# Patient Record
Sex: Female | Born: 1970 | Race: Black or African American | Hispanic: No | Smoking: Current every day smoker
Health system: Southern US, Community
[De-identification: ages and names within clinical notes are randomized; demographics above are authoritative.]

## PROBLEM LIST (undated history)

## (undated) DIAGNOSIS — E119 Type 2 diabetes mellitus without complications: Secondary | ICD-10-CM

## (undated) DIAGNOSIS — I10 Essential (primary) hypertension: Secondary | ICD-10-CM

## (undated) HISTORY — PX: CARDIAC SURGERY: SHX584

## (undated) HISTORY — PX: APPENDECTOMY: SHX54

## (undated) HISTORY — PX: HERNIA REPAIR: SHX51

## (undated) HISTORY — PX: TONSILLECTOMY: SUR1361

---

## 2019-06-24 ENCOUNTER — Other Ambulatory Visit: Payer: Self-pay

## 2019-06-24 ENCOUNTER — Encounter: Payer: Self-pay | Admitting: Emergency Medicine

## 2019-06-24 ENCOUNTER — Emergency Department: Payer: Self-pay

## 2019-06-24 DIAGNOSIS — R0789 Other chest pain: Secondary | ICD-10-CM | POA: Insufficient documentation

## 2019-06-24 DIAGNOSIS — I10 Essential (primary) hypertension: Secondary | ICD-10-CM | POA: Insufficient documentation

## 2019-06-24 DIAGNOSIS — E119 Type 2 diabetes mellitus without complications: Secondary | ICD-10-CM | POA: Insufficient documentation

## 2019-06-24 DIAGNOSIS — R630 Anorexia: Secondary | ICD-10-CM | POA: Insufficient documentation

## 2019-06-24 DIAGNOSIS — R5383 Other fatigue: Secondary | ICD-10-CM | POA: Insufficient documentation

## 2019-06-24 DIAGNOSIS — R1013 Epigastric pain: Secondary | ICD-10-CM | POA: Insufficient documentation

## 2019-06-24 DIAGNOSIS — R7989 Other specified abnormal findings of blood chemistry: Secondary | ICD-10-CM | POA: Insufficient documentation

## 2019-06-24 DIAGNOSIS — F17200 Nicotine dependence, unspecified, uncomplicated: Secondary | ICD-10-CM | POA: Insufficient documentation

## 2019-06-24 DIAGNOSIS — Z79899 Other long term (current) drug therapy: Secondary | ICD-10-CM | POA: Insufficient documentation

## 2019-06-24 LAB — CBC WITH DIFFERENTIAL/PLATELET
Abs Immature Granulocytes: 0.02 10*3/uL (ref 0.00–0.07)
Basophils Absolute: 0 10*3/uL (ref 0.0–0.1)
Basophils Relative: 0 %
Eosinophils Absolute: 0 10*3/uL (ref 0.0–0.5)
Eosinophils Relative: 1 %
HCT: 39.7 % (ref 36.0–46.0)
Hemoglobin: 13.7 g/dL (ref 12.0–15.0)
Immature Granulocytes: 0 %
Lymphocytes Relative: 40 %
Lymphs Abs: 3.5 10*3/uL (ref 0.7–4.0)
MCH: 32.5 pg (ref 26.0–34.0)
MCHC: 34.5 g/dL (ref 30.0–36.0)
MCV: 94.1 fL (ref 80.0–100.0)
Monocytes Absolute: 0.6 10*3/uL (ref 0.1–1.0)
Monocytes Relative: 7 %
Neutro Abs: 4.6 10*3/uL (ref 1.7–7.7)
Neutrophils Relative %: 52 %
Platelets: 265 10*3/uL (ref 150–400)
RBC: 4.22 MIL/uL (ref 3.87–5.11)
RDW: 11.9 % (ref 11.5–15.5)
WBC: 8.8 10*3/uL (ref 4.0–10.5)
nRBC: 0 % (ref 0.0–0.2)

## 2019-06-24 LAB — TROPONIN I (HIGH SENSITIVITY): Troponin I (High Sensitivity): 4 ng/L (ref <18)

## 2019-06-24 LAB — COMPREHENSIVE METABOLIC PANEL
ALT: 233 U/L — ABNORMAL HIGH (ref 0–44)
AST: 124 U/L — ABNORMAL HIGH (ref 15–41)
Albumin: 4.4 g/dL (ref 3.5–5.0)
Alkaline Phosphatase: 117 U/L (ref 38–126)
Anion gap: 9 (ref 5–15)
BUN: 7 mg/dL (ref 6–20)
CO2: 26 mmol/L (ref 22–32)
Calcium: 9.5 mg/dL (ref 8.9–10.3)
Chloride: 103 mmol/L (ref 98–111)
Creatinine, Ser: 0.47 mg/dL (ref 0.44–1.00)
GFR calc Af Amer: >60 mL/min (ref >60)
GFR calc non Af Amer: >60 mL/min (ref >60)
Glucose, Bld: 131 mg/dL — ABNORMAL HIGH (ref 70–99)
Potassium: 4.3 mmol/L (ref 3.5–5.1)
Sodium: 138 mmol/L (ref 135–145)
Total Bilirubin: 1.4 mg/dL — ABNORMAL HIGH (ref 0.3–1.2)
Total Protein: 8.1 g/dL (ref 6.5–8.1)

## 2019-06-24 LAB — URINALYSIS, COMPLETE (UACMP) WITH MICROSCOPIC
Bacteria, UA: NONE SEEN
Bilirubin Urine: NEGATIVE
Glucose, UA: NEGATIVE mg/dL
Hgb urine dipstick: NEGATIVE
Ketones, ur: NEGATIVE mg/dL
Leukocytes,Ua: NEGATIVE
Nitrite: NEGATIVE
Protein, ur: NEGATIVE mg/dL
Specific Gravity, Urine: 1.003 — ABNORMAL LOW (ref 1.005–1.030)
pH: 7 (ref 5.0–8.0)

## 2019-06-24 LAB — LIPASE, BLOOD: Lipase: 22 U/L (ref 11–51)

## 2019-06-24 LAB — GLUCOSE, CAPILLARY: Glucose-Capillary: 124 mg/dL — ABNORMAL HIGH (ref 70–99)

## 2019-06-24 LAB — POCT PREGNANCY, URINE: Preg Test, Ur: NEGATIVE

## 2019-06-24 NOTE — ED Triage Notes (Addendum)
Pt to triage via w/c with no distress noted, mask in place; pt reports today having upper chest pain "burning" accomp by HA and HTN; st hx acid reflux

## 2019-06-25 ENCOUNTER — Emergency Department
Admission: EM | Admit: 2019-06-25 | Discharge: 2019-06-25 | Disposition: A | Discharge status: Home / Self Care | Payer: Self-pay | Attending: Emergency Medicine | Admitting: Emergency Medicine

## 2019-06-25 DIAGNOSIS — R0789 Other chest pain: Secondary | ICD-10-CM

## 2019-06-25 DIAGNOSIS — R1013 Epigastric pain: Secondary | ICD-10-CM

## 2019-06-25 DIAGNOSIS — R7989 Other specified abnormal findings of blood chemistry: Secondary | ICD-10-CM

## 2019-06-25 HISTORY — DX: Type 2 diabetes mellitus without complications: E11.9

## 2019-06-25 HISTORY — DX: Essential (primary) hypertension: I10

## 2019-06-25 LAB — TROPONIN I (HIGH SENSITIVITY): Troponin I (High Sensitivity): 3 ng/L (ref <18)

## 2019-06-25 MED ORDER — LIDOCAINE VISCOUS HCL 2 % MT SOLN
15.0000 mL | Freq: Once | OROMUCOSAL | Status: AC
  Administered 2019-06-25: 15 mL via ORAL
  Filled 2019-06-25: qty 15

## 2019-06-25 MED ORDER — ALUM & MAG HYDROXIDE-SIMETH 200-200-20 MG/5ML PO SUSP
30.0000 mL | Freq: Once | ORAL | Status: AC
  Administered 2019-06-25: 30 mL via ORAL
  Filled 2019-06-25: qty 30

## 2019-06-25 NOTE — ED Notes (Signed)
MD at bedside. 

## 2019-06-25 NOTE — Discharge Instructions (Addendum)
As we discussed, your work-up was generally reassuring today.  You have elevated liver function tests (LFTs) and it is unclear right now why that is the case because you had a normal right upper quadrant ultrasound and your lipase was normal.  When you follow-up with your regular doctor, particularly your gastroenterologist, please bring this paperwork with you so that he or she knows that you had a normal right upper quadrant ultrasound, and that your lab work was normal (including your lipase) except for elevated LFTs (AST 124, ALT 233, total bilirubin 1.4).  Please drink plenty of fluids, avoid alcohol and Tylenol, and follow-up with your primary care doctor or gastroenterologist at the next available opportunity.  Continue using your normal medications.  Return to the nearest emergency department if you develop any new or worsening symptoms that concern you.

## 2019-06-25 NOTE — ED Provider Notes (Signed)
[facility] Emergency Department Provider Note  ____________________________________________   First MD Initiated Contact with Patient 06/25/19 0136     (approximate)  I have reviewed the triage vital signs and the nursing notes.   HISTORY  Chief Complaint Chest Pain    HPI Carol Griffin is a 49 y.o. female with medical history as listed below who also states that she had "2 mild heart attacks" most recently about 3 months ago.  She lives in [place] is visiting this area for 2 weeks but is headed back home.  She came in tonight because she is having pain in the upper part of her chest as well as a burning sensation in the upper part of her abdomen.  She said the chest pain is burning as well but is also reproducible when pushing on her chest.  No shortness of breath, and she denies fever/chills, nausea, vomiting, lower abdominal pain, and dysuria.  She has acid reflux and associates this with the reflux.  She has not had much appetite today.  She denies drinking alcohol and has no new medications.  She said that after she gets home to [place] she has an appointment for an endoscopy with her gastroenterologist.  Nothing particular makes the symptoms better or worse.  She has not had her gallbladder removed but has no known history of gallstones.         Past Medical History:  Diagnosis Date  . Diabetes mellitus without complication (HCC)   . Hypertension     There are no problems to display for this patient.   Past Surgical History:  Procedure Laterality Date  . APPENDECTOMY    . CARDIAC SURGERY    . HERNIA REPAIR    . TONSILLECTOMY      Prior to Admission medications   Not on File    Allergies Motrin [ibuprofen]  No family history on file.  Social History Social History   Tobacco Use  . Smoking status: Current Every Day Smoker  . Smokeless tobacco: Never Used  Substance Use Topics  . Alcohol use: Not on file  . Drug use: Not on  file    Review of Systems Constitutional: No fever/chills Eyes: No visual changes.  ENT: No sore throat. Cardiovascular: Central upper chest pain, reproducible with palpation. Respiratory: Denies shortness of breath. Gastrointestinal: Upper abdominal pain, no nausea nor vomiting. Genitourinary: Negative for dysuria. Musculoskeletal: Negative for neck pain.  Negative for back pain. Integumentary: Negative for rash. Neurological: Negative for headaches, focal weakness or numbness.   ____________________________________________   PHYSICAL EXAM:  VITAL SIGNS: ED Triage Vitals  Enc Vitals Group     BP 06/24/19 2054 (!) 147/78     Pulse Rate 06/24/19 2054 85     Resp 06/24/19 2054 18     Temp 06/24/19 2054 98.1 F (36.7 C)     Temp Source 06/24/19 2054 Oral     SpO2 06/24/19 2054 100 %     Weight 06/24/19 2051 59 kg (130 lb)     Height 06/24/19 2051 1.524 m (5')     Head Circumference --      Peak Flow --      Pain Score 06/24/19 2050 6     Pain Loc --      Pain Edu? --      Excl. in GC? --     Constitutional: Alert and oriented.  Resting quietly.  No acute distress. Eyes: Conjunctivae are normal.  Head: Atraumatic.  Nose: No congestion/rhinnorhea. Mouth/Throat: Patient is wearing a mask. Neck: No stridor.  No meningeal signs.   Cardiovascular: Normal rate, regular rhythm. Good peripheral circulation. Grossly normal heart sounds. Respiratory: Normal respiratory effort.  No retractions. Gastrointestinal: Soft and nondistended.  Mild tenderness to palpation of the epigastrium, no right upper quadrant tenderness and negative Murphy sign.  No lower abdominal tenderness to palpation. Musculoskeletal: Reproducible anterior chest wall tenderness to palpation.  No lower extremity tenderness nor edema. No gross deformities of extremities. Neurologic:  Normal speech and language. No gross focal neurologic deficits are appreciated.  Skin:  Skin is warm, dry and intact. Psychiatric:  Mood and affect are normal. Speech and behavior are normal.  ____________________________________________   LABS (all labs ordered are listed, but only abnormal results are displayed)  Labs Reviewed  COMPREHENSIVE METABOLIC PANEL - Abnormal; Notable for the following components:      Result Value   Glucose, Bld 131 (*)    AST 124 (*)    ALT 233 (*)    Total Bilirubin 1.4 (*)    All other components within normal limits  GLUCOSE, CAPILLARY - Abnormal; Notable for the following components:   Glucose-Capillary 124 (*)    All other components within normal limits  URINALYSIS, COMPLETE (UACMP) WITH MICROSCOPIC - Abnormal; Notable for the following components:   Color, Urine STRAW (*)    APPearance CLEAR (*)    Specific Gravity, Urine 1.003 (*)    All other components within normal limits  CBC WITH DIFFERENTIAL/PLATELET  LIPASE, BLOOD  CBG MONITORING, ED  POCT PREGNANCY, URINE  TROPONIN I (HIGH SENSITIVITY)  TROPONIN I (HIGH SENSITIVITY)   ____________________________________________  EKG  ED ECG REPORT I, Hinda Kehr, the attending physician, personally viewed and interpreted this ECG.  Date: 06/24/2019 EKG Time: 20:58 Rate: 72 okay Rhythm: normal sinus rhythm QRS Axis: normal Intervals: normal ST/T Wave abnormalities: normal Narrative Interpretation: no evidence of acute ischemia  ____________________________________________  RADIOLOGY I, Hinda Kehr, personally viewed and evaluated these images (plain radiographs) as part of my medical decision making, as well as reviewing the written report by the radiologist.  ED MD interpretation: No acute abnormalities on chest x-ray.  No acute abnormalities on right upper quadrant ultrasound of than a simple appearing cyst in the right kidney.  Official radiology report(s): DG Chest 2 View  Result Date: 06/24/2019 CLINICAL DATA:  Upper chest burning, headache, hypertension EXAM: CHEST - 2 VIEW COMPARISON:  None. FINDINGS:  The heart size and mediastinal contours are within normal limits. Both lungs are clear. The visualized skeletal structures are unremarkable. IMPRESSION: No active cardiopulmonary disease. Electronically Signed   By: Randa Ngo M.D.   On: 06/24/2019 21:13   US ABDOMEN LIMITED RUQ  Result Date: 06/25/2019 CLINICAL DATA:  Upper abdominal pain with elevated LFT EXAM: ULTRASOUND ABDOMEN LIMITED RIGHT UPPER QUADRANT COMPARISON:  None. FINDINGS: Gallbladder: No gallstones or wall thickening visualized. No sonographic Murphy sign noted by sonographer. Common bile duct: Diameter: 2.1 mm Liver: No focal lesion identified. Within normal limits in parenchymal echogenicity. Portal vein is patent on color Doppler imaging with normal direction of blood flow towards the liver. Other: Incidental note made of a cyst within the right kidney measuring 2.6 cm IMPRESSION: Negative right upper quadrant abdominal ultrasound aside from a simple appearing cyst in the right kidney. Electronically Signed   By: Donavan Foil M.D.   On: 06/25/2019 00:39    ____________________________________________   PROCEDURES   Procedure(s) performed (including Critical Care):  .1-3  Lead EKG Interpretation Performed by: Loleta Rose, MD Authorized by: Loleta Rose, MD     Interpretation: normal     ECG rate:  75   ECG rate assessment: normal     Rhythm: sinus rhythm     Ectopy: none     Conduction: normal       ____________________________________________   INITIAL IMPRESSION / MDM / ASSESSMENT AND PLAN / ED COURSE  As part of my medical decision making, I reviewed the following data within the electronic MEDICAL RECORD NUMBER Nursing notes reviewed and incorporated, Labs reviewed , EKG interpreted , Old chart reviewed, Radiograph reviewed , Notes from prior ED visits and [place] Controlled Substance Database   Differential diagnosis includes, but is not limited to, musculoskeletal pain, ACS, PE, gallbladder disease,  pancreatitis, nonspecific hepatitis.  Patient is well-appearing and in no distress and has been resting for the last 6 hours in the emergency department.  The patient was on the cardiac monitor to evaluate for evidence of arrhythmia and/or significant heart rate changes.   High-sensitivity troponins are normal x2.  Patient has very easily reproducible tenderness to palpation of the anterior chest wall.  No ischemic changes on EKG.  Vital signs are normal and stable.  Lab work is notable only for AST and ALT elevation with an essentially normal total bilirubin of only about 1.4.  Lipase is normal and she has no scleral icterus.  CBC is within normal limits.  She denies alcohol use.  The patient takes clopidogrel and is compliant with her medications.  I think that she is suffering mostly from acid reflux but I cannot explain her AST and ALT elevation at this time.  Her ultrasound was reassuring and her lipase is normal.  She does not want to stay and wants to get back to [place] and I think this is appropriate since she is already established with a gastroenterologist.  There is no indication that she needs admission at this time as she is tolerating oral intake in the emergency department.  I advised her to follow-up as soon as she gets home and she understands and agrees with the plan.  I gave my usual return precautions as well.  ____________________________________________  FINAL CLINICAL IMPRESSION(S) / ED DIAGNOSES  Final diagnoses:  Elevated LFTs  Epigastric pain  Chest wall pain     MEDICATIONS GIVEN DURING THIS VISIT:  Medications  alum & mag hydroxide-simeth (MAALOX/MYLANTA) 200-200-20 MG/5ML suspension 30 mL (30 mLs Oral Given 06/25/19 0233)    And  lidocaine (XYLOCAINE) 2 % viscous mouth solution 15 mL (15 mLs Oral Given 06/25/19 0235)     ED Discharge Orders    None      *Please note:  Carol Griffin was evaluated in Emergency Department on 06/25/2019 for the symptoms  described in the history of present illness. She was evaluated in the context of the global COVID-19 pandemic, which necessitated consideration that the patient might be at risk for infection with the SARS-CoV-2 virus that causes COVID-19. Institutional protocols and algorithms that pertain to the evaluation of patients at risk for COVID-19 are in a state of rapid change based on information released by regulatory bodies including the CDC and federal and state organizations. These policies and algorithms were followed during the patient's care in the ED.  Some ED evaluations and interventions may be delayed as a result of limited staffing during the pandemic.*  Note:  This document was prepared using Dragon voice recognition software and may include  unintentional dictation errors.   Loleta Rose, MD 06/25/19 [phone]

## 2019-06-25 NOTE — ED Notes (Signed)
Pt requesting ice, food and a new warm blanket. All provided and lights dimmed with call bell in reach. Pt reports feeling tired and wanting to rest.

## 2019-06-25 NOTE — ED Notes (Signed)
Pt reporting the medications have not helped with pts discomfort but RN able to provide update on results and treatment plan with follow up and pt verbalized understanding.

## 2021-08-04 IMAGING — US US ABDOMEN LIMITED
1 series · 14 of 25 positions shown · non-contrast
Comparison: None.

CLINICAL DATA: Upper abdominal pain with elevated LFT

EXAM:
ULTRASOUND ABDOMEN LIMITED RIGHT UPPER QUADRANT

[Series 1: us abdomen limited · 0.17mm/px · 14 of 43 slices shown]
[im 1/43]
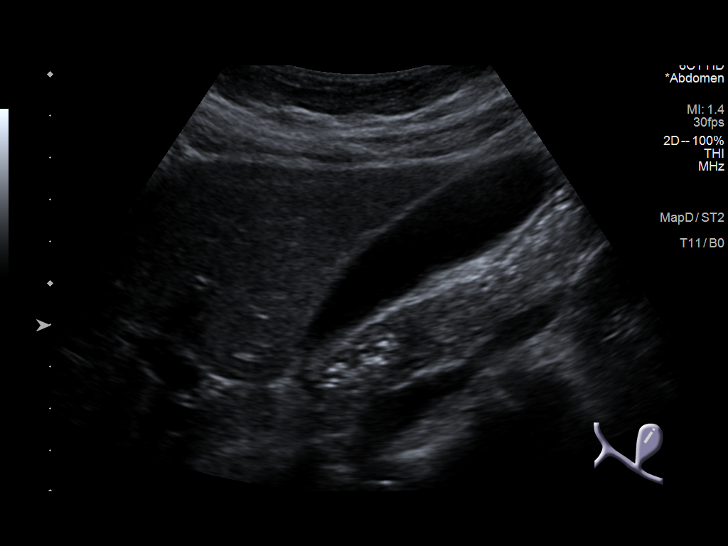
[im 4/43]
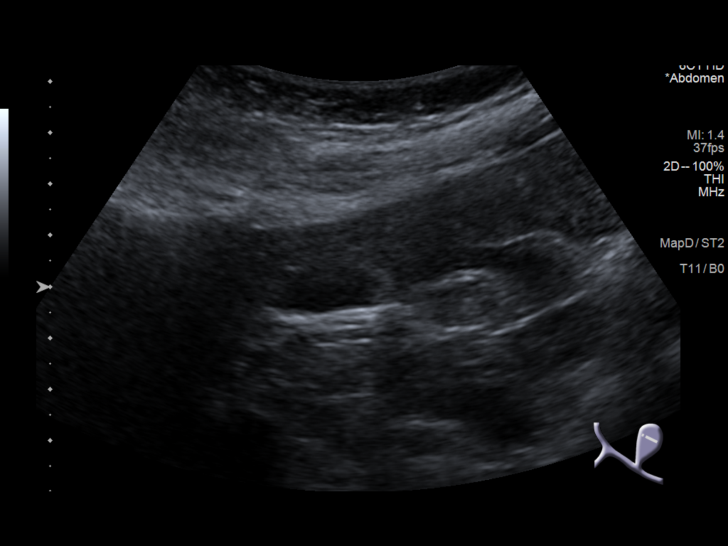
[im 8/43]
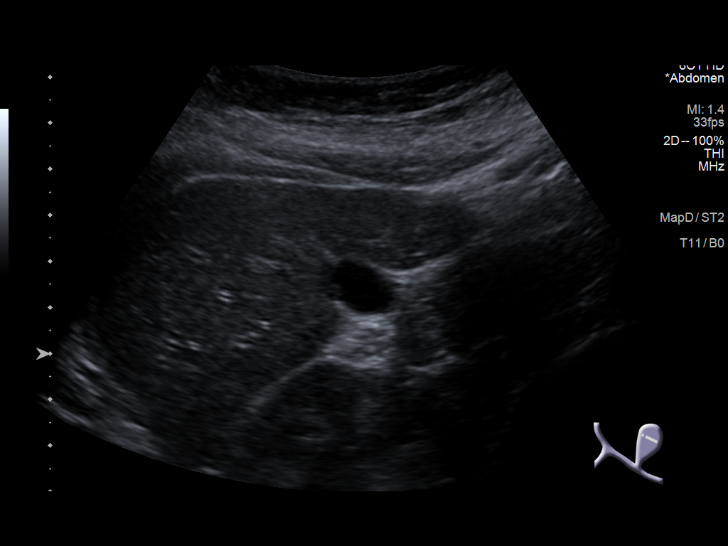
[im 11/43]
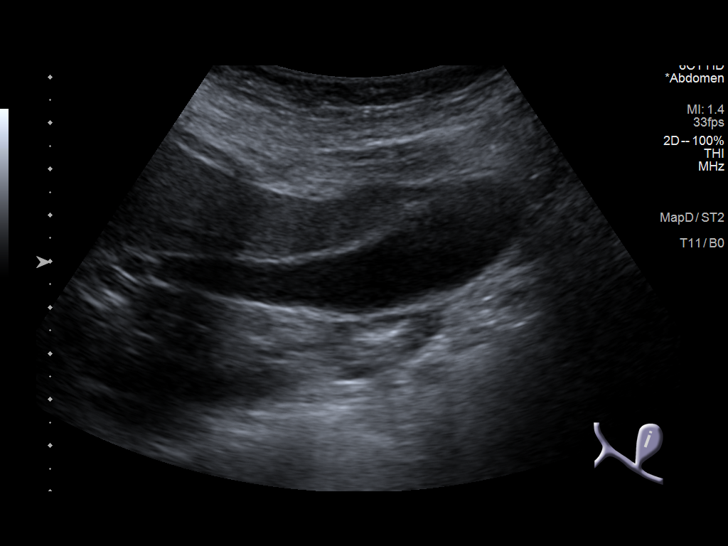
[im 15/43]
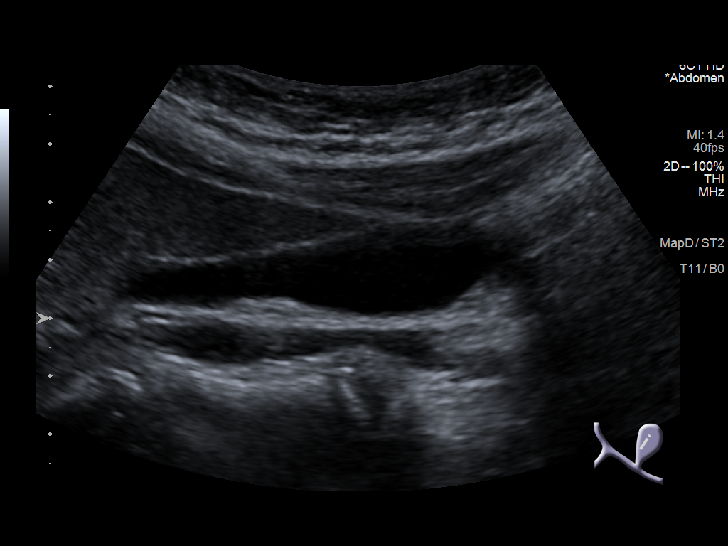
[im 16/43]
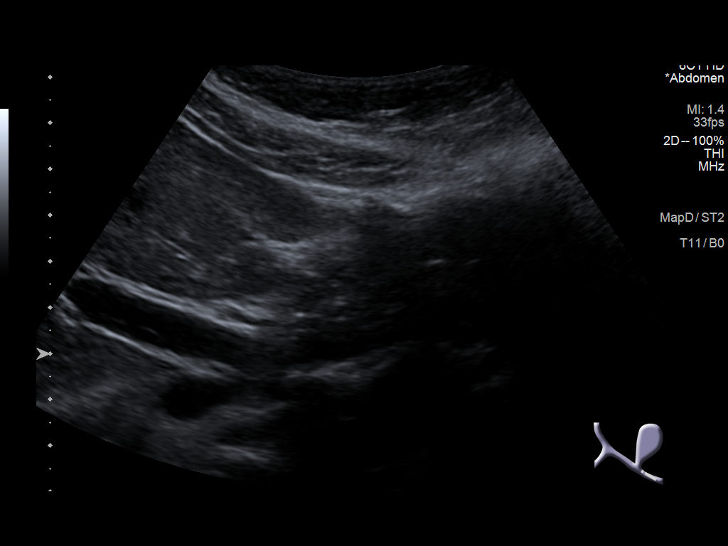
[im 20/43]
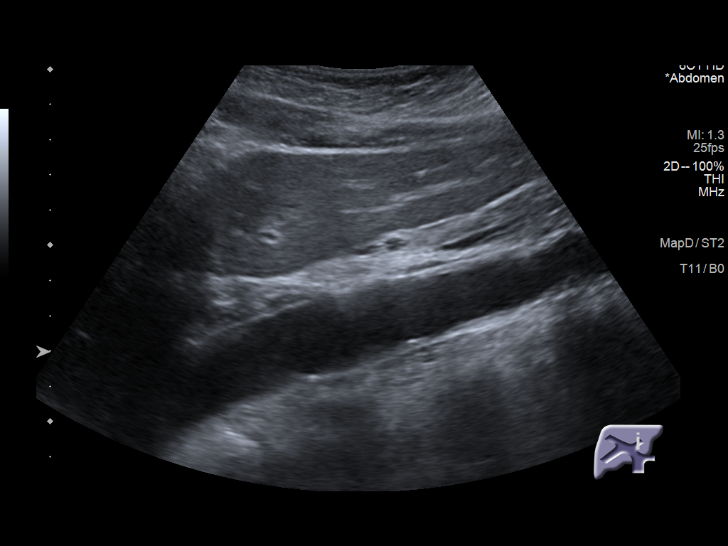
[im 23/43]
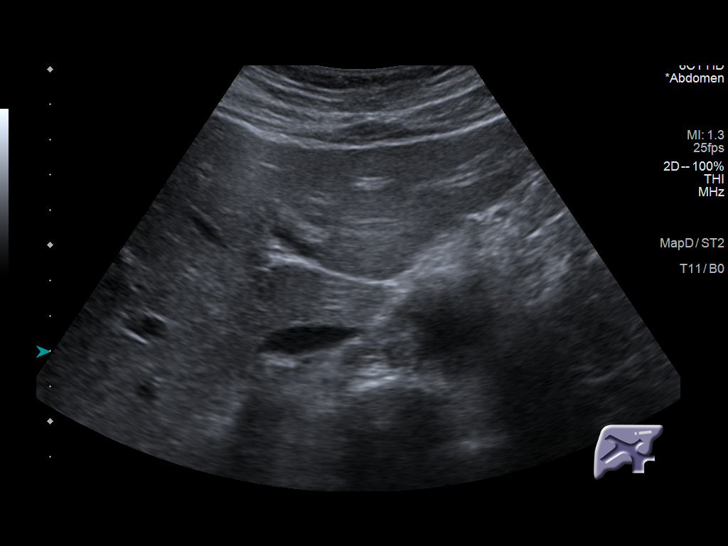
[im 27/43]
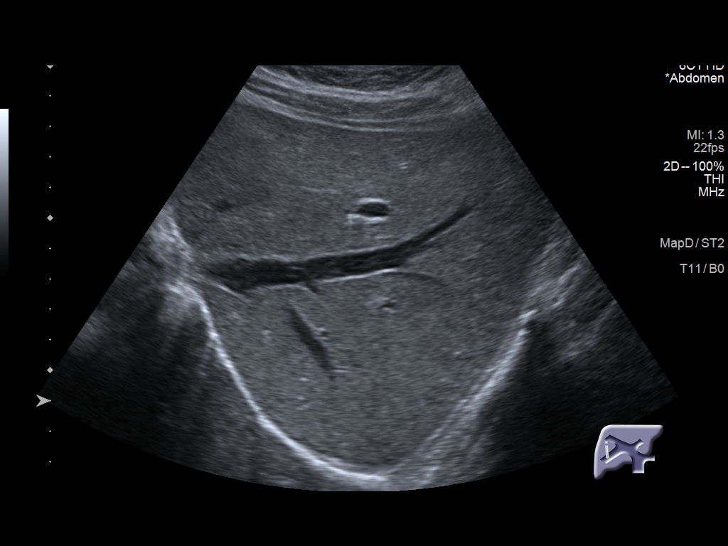
[im 29/43]
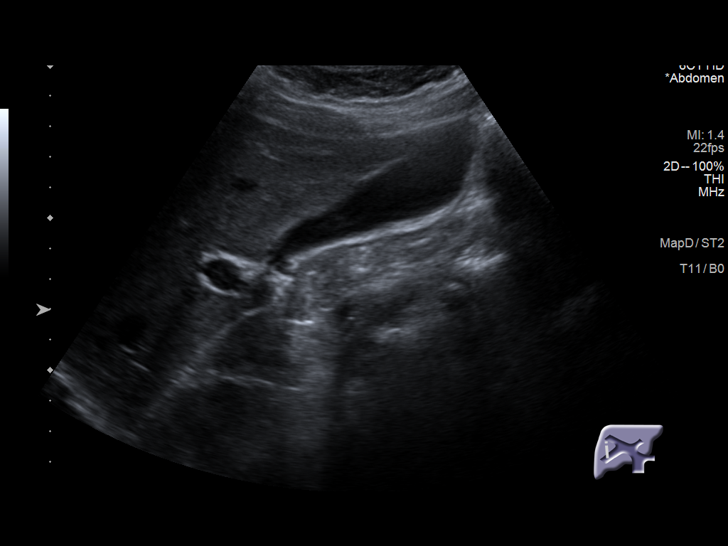
[im 32/43]
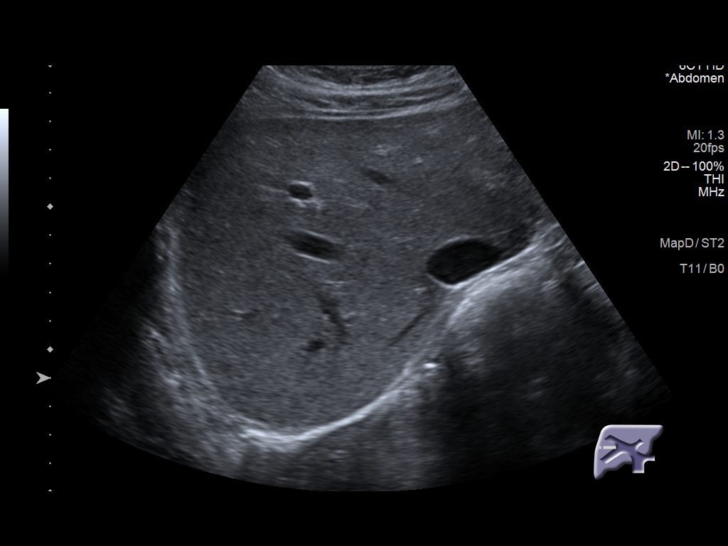
[im 36/43]
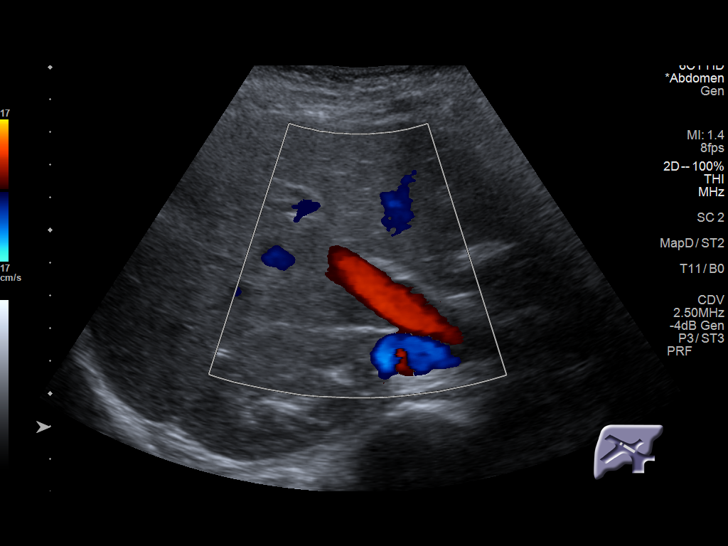
[im 39/43]
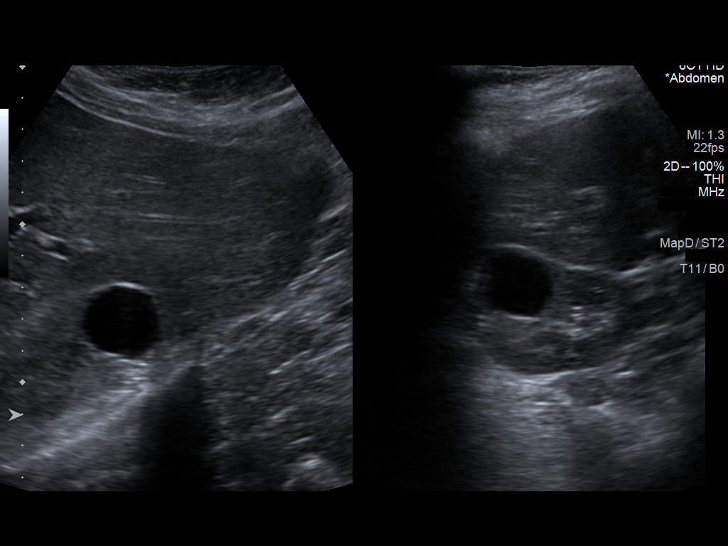
[im 43/43]
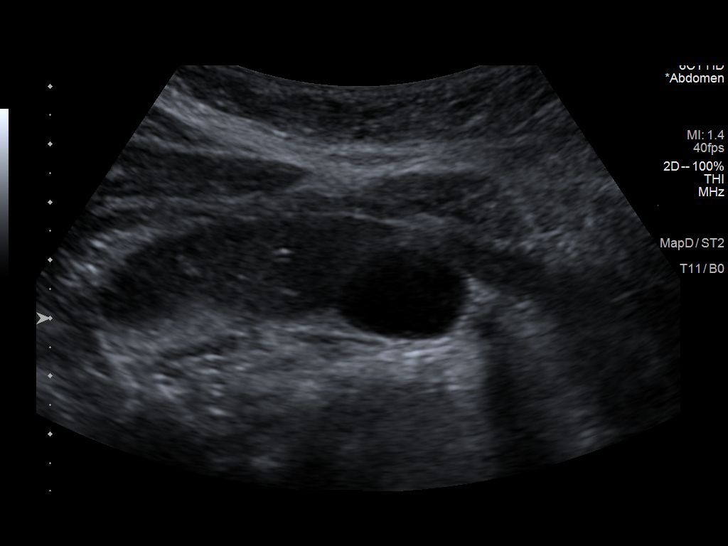

[14 of 25 positions shown; findings below may reference images not displayed]

FINDINGS: Gallbladder:

No gallstones or wall thickening visualized. No sonographic Murphy
sign noted by sonographer.

Common bile duct:

Diameter: 2.1 mm

Liver:

No focal lesion identified. Within normal limits in parenchymal
echogenicity. Portal vein is patent on color Doppler imaging with
normal direction of blood flow towards the liver.

Other: Incidental note made of a cyst within the right kidney
measuring 2.6 cm
IMPRESSION: Negative right upper quadrant abdominal ultrasound aside from a
simple appearing cyst in the right kidney.

## 2021-08-04 IMAGING — CR DG CHEST 2V
1 series · 2 of 2 positions shown · non-contrast
Comparison: None.

CLINICAL DATA: Upper chest burning, headache, hypertension

EXAM:
CHEST - 2 VIEW

[Series 1: dg chest 2 view · 0.14mm/px · 2 of 2 slices shown]
[im 1/2]
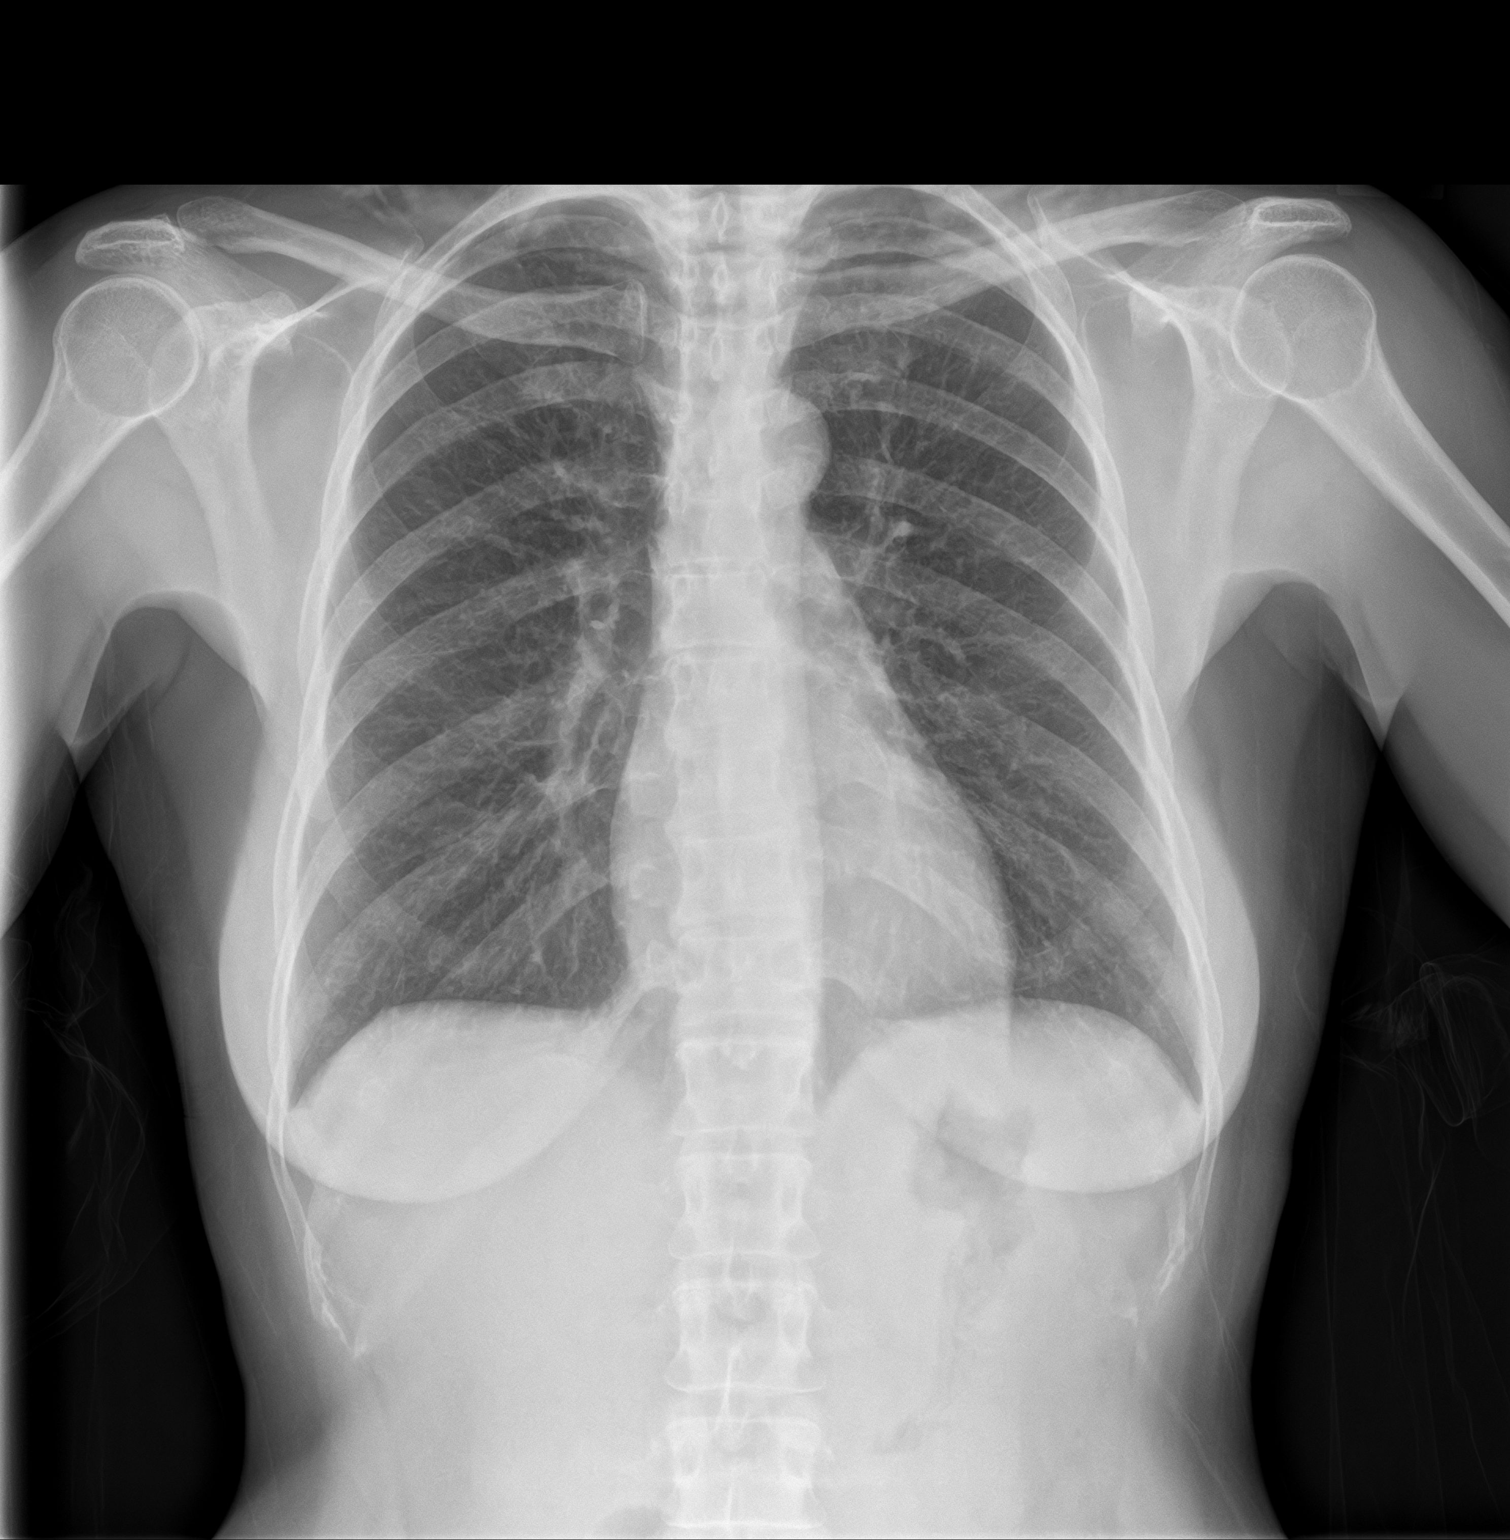
[im 2/2]
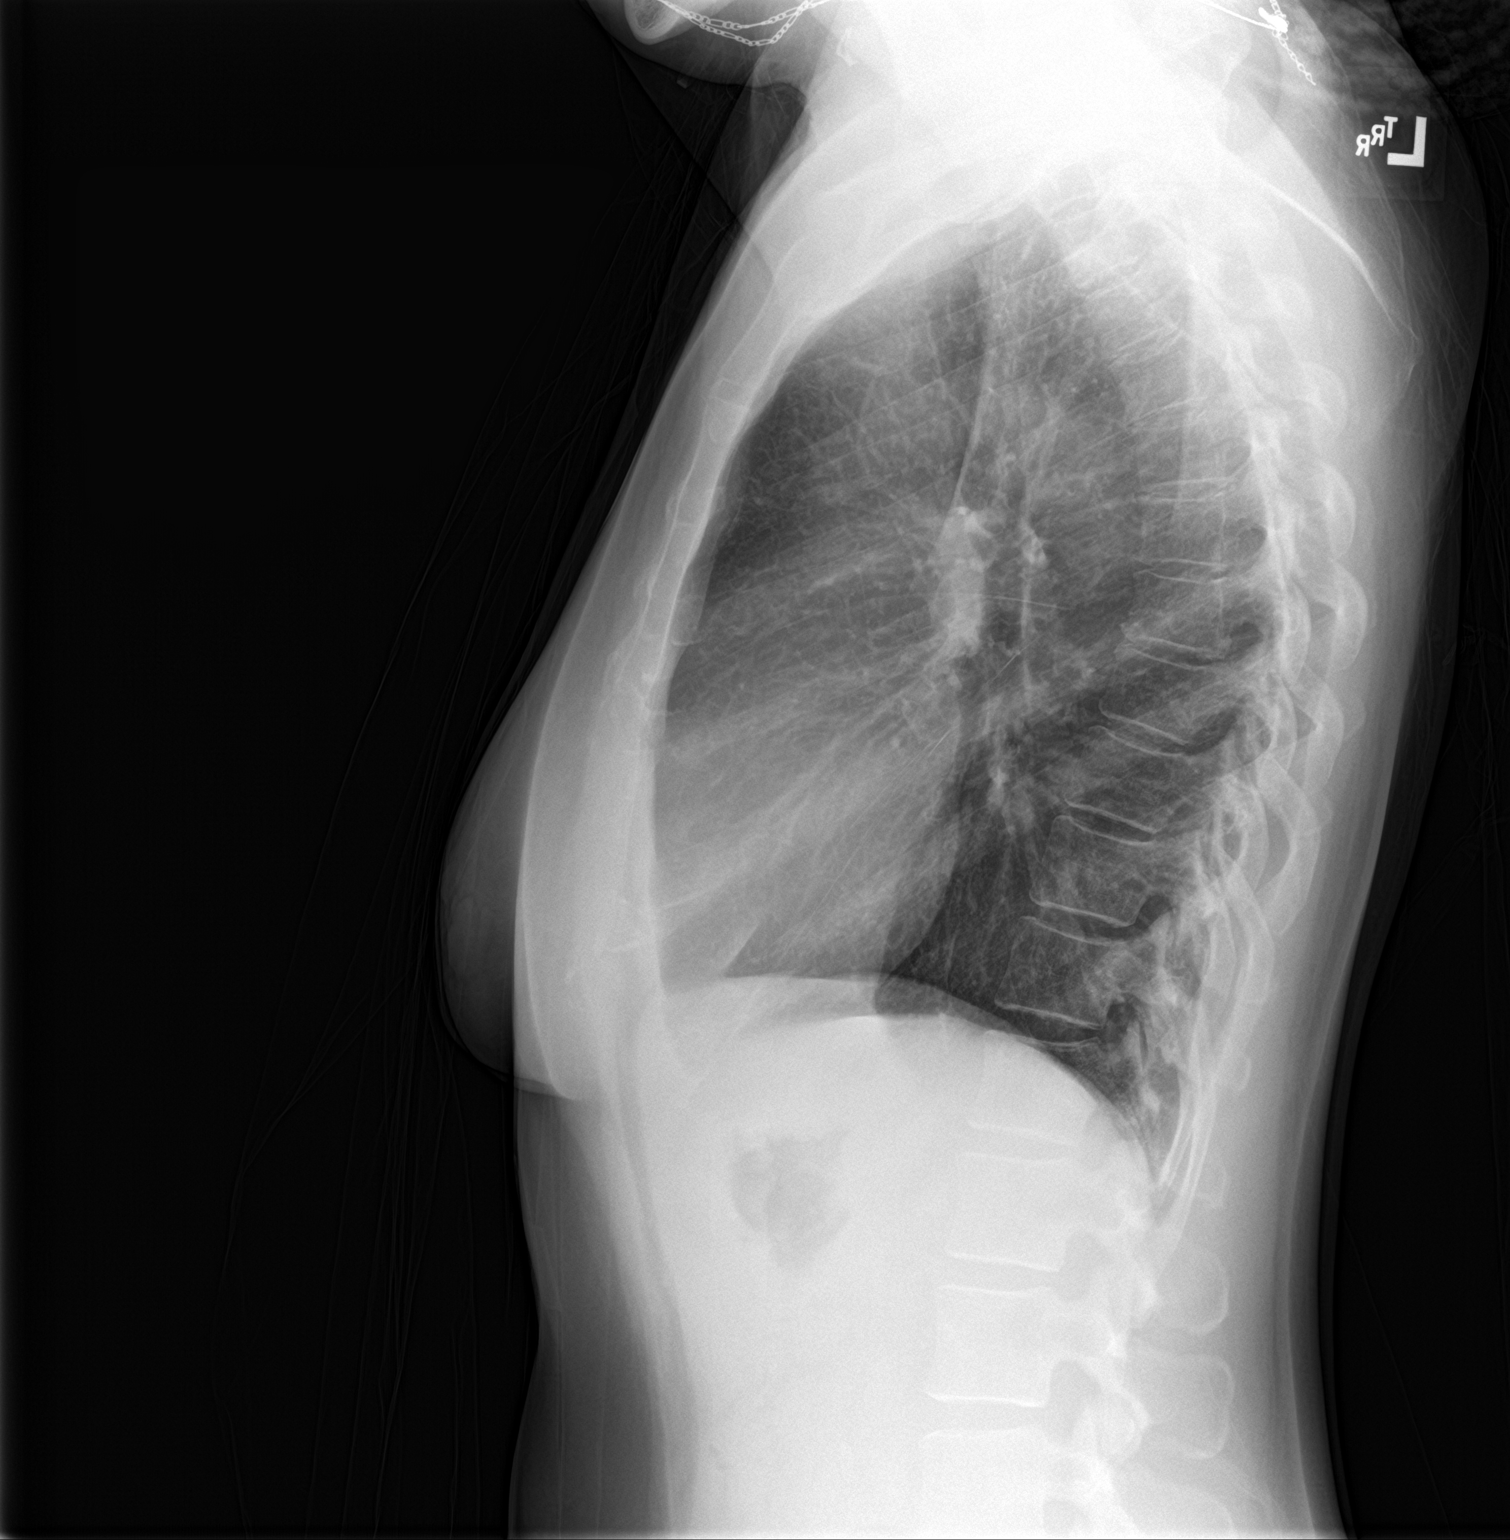

[2 of 2 positions shown; findings below may reference images not displayed]

FINDINGS: The heart size and mediastinal contours are within normal limits.
Both lungs are clear. The visualized skeletal structures are
unremarkable.
IMPRESSION: No active cardiopulmonary disease.
